# Patient Record
Sex: Male | Born: 1965 | Race: White | Hispanic: No | Marital: Married | Smoking: Never smoker
Health system: Southern US, Community
[De-identification: ages and names within clinical notes are randomized; demographics above are authoritative.]

## PROBLEM LIST (undated history)

## (undated) DIAGNOSIS — K449 Diaphragmatic hernia without obstruction or gangrene: Secondary | ICD-10-CM

## (undated) HISTORY — PX: WISDOM TOOTH EXTRACTION: SHX21

## (undated) HISTORY — DX: Diaphragmatic hernia without obstruction or gangrene: K44.9

---

## 2015-09-18 ENCOUNTER — Other Ambulatory Visit: Payer: Self-pay | Admitting: Gastroenterology

## 2015-09-18 DIAGNOSIS — R1310 Dysphagia, unspecified: Secondary | ICD-10-CM

## 2015-09-18 DIAGNOSIS — R109 Unspecified abdominal pain: Secondary | ICD-10-CM

## 2015-09-19 ENCOUNTER — Ambulatory Visit
Admission: RE | Admit: 2015-09-19 | Discharge: 2015-09-19 | Disposition: A | Discharge status: Home / Self Care | Payer: PRIVATE HEALTH INSURANCE | Source: Ambulatory Visit | Attending: Gastroenterology | Admitting: Gastroenterology

## 2015-09-19 DIAGNOSIS — R109 Unspecified abdominal pain: Secondary | ICD-10-CM

## 2015-09-19 DIAGNOSIS — R1310 Dysphagia, unspecified: Secondary | ICD-10-CM

## 2015-10-24 ENCOUNTER — Ambulatory Visit: Payer: Self-pay | Admitting: Allergy and Immunology

## 2015-11-20 ENCOUNTER — Encounter: Payer: Self-pay | Admitting: Allergy and Immunology

## 2015-11-20 ENCOUNTER — Ambulatory Visit (INDEPENDENT_AMBULATORY_CARE_PROVIDER_SITE_OTHER): Payer: PRIVATE HEALTH INSURANCE | Admitting: Allergy and Immunology

## 2015-11-20 VITALS — BP 128/90 | HR 60 | Temp 97.8°F | Resp 16 | Ht 71.4 in | Wt 236.2 lb

## 2015-11-20 DIAGNOSIS — K219 Gastro-esophageal reflux disease without esophagitis: Secondary | ICD-10-CM | POA: Diagnosis not present

## 2015-11-20 DIAGNOSIS — J3089 Other allergic rhinitis: Secondary | ICD-10-CM | POA: Diagnosis not present

## 2015-11-20 DIAGNOSIS — J387 Other diseases of larynx: Secondary | ICD-10-CM

## 2015-11-20 NOTE — Patient Instructions (Addendum)
  1. Allergen avoidance measures?  2. Slowly, slowly taper off all caffeine and chocolate consumption  3. Utilize antireflux therapy for the remainder of 2017 with omeprazole 40 mg one time per day  4. Further evaluation for allergic disease?  5. Review biopsy results from upper endoscopy with Dr. Ewing SchleinMagod  6. Return to clinic 6 months or earlier if problem

## 2015-11-20 NOTE — Progress Notes (Signed)
NEW PATIENT NOTE  Referring Provider: No ref. provider found Primary Provider: Marylynn PearsonBurkhart,Brian, MD Date of office visit: 11/20/2015    Subjective:   Chief Complaint:  Andres Santos (DOB: 10/08/1965) is a 50 y.o. male with a chief complaint of Gastroesophageal Reflux  who presents to the clinic on 11/20/2015 with the following problems:  HPI: Andres Santos presents this clinic in evaluation of possible allergies. Apparently Andres Santos has a fair amount of problem with postnasal drip and throat clearing and raspy voice for which he has seen Dr. Marcheta GrammesMa and has had an upper endoscopy in evaluation of gastroesophageal reflux disease that may of been precipitated by consistent use of aspirin starting October 2016. His upper endoscopy was performed by Dr. Ewing SchleinMagod in 2017 and apparently identified inflammation although I do not have the results of that report for review at this point in time. He has very little upper airway symptoms except for some occasional congestion and apparently he has a deviated septum. He does not have any issues with ugly nasal discharge or inability to smell. On a rare occasion he'll get a pressure-like headache. In regard to his GERD he was treated for 3 months with a proton pump inhibitor and he thinks that most of his classic GERD symptoms are pretty much gone. He does drink one coffee per day and 2 teas per day. He still occasionally has a reflux event up into his throat.  Past Medical History  Diagnosis Date  . Hiatal hernia     Past Surgical History  Procedure Laterality Date  . Wisdom tooth extraction        Medication List    None    Allergies  Allergen Reactions  . Penicillins Rash    Review of systems negative except as noted in HPI / PMHx or noted below:  Review of Systems  Constitutional: Negative.   HENT: Negative.   Eyes: Negative.   Respiratory: Negative.   Cardiovascular: Negative.   Gastrointestinal: Negative.   Genitourinary: Negative.     Musculoskeletal: Negative.        Intermittent neck and shoulder pain  Skin: Negative.   Neurological: Negative.   Endo/Heme/Allergies: Negative.   Psychiatric/Behavioral: Negative.     Family History  Problem Relation Age of Onset  . Breast cancer Maternal Grandmother   . Heart Problems Maternal Grandfather     Social History   Social History  . Marital Status: Married    Spouse Name: N/A  . Number of Children: N/A  . Years of Education: N/A   Occupational History  . Not on file.   Social History Main Topics  . Smoking status: Never Smoker   . Smokeless tobacco: Never Used  . Alcohol Use: Yes  . Drug Use: No  . Sexual Activity: Not on file   Other Topics Concern  . Not on file   Social History Narrative  . 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  Raspy voice  HENT:  Head: Normocephalic. Head is without right periorbital erythema and without left periorbital erythema.  Right Ear: Tympanic membrane, external ear and ear canal normal.  Left Ear: Tympanic membrane, external ear and ear canal normal.  Nose: Nose normal. No mucosal edema or rhinorrhea.  Mouth/Throat: Oropharynx is clear and moist and mucous membranes are normal. No oropharyngeal exudate.  Eyes: Conjunctivae and lids are normal. Pupils are equal, round, and reactive to light.  Neck: Trachea normal. No tracheal deviation present. No thyromegaly present.  Cardiovascular: Normal  rate, regular rhythm, S1 normal, S2 normal and normal heart sounds.   No murmur heard. Pulmonary/Chest: Effort normal. No stridor. No tachypnea. No respiratory distress. He has no wheezes. He has no rales. He exhibits no tenderness.  Abdominal: Soft. He exhibits no distension and no mass. There is no hepatosplenomegaly. There is no tenderness. There is no rebound and no guarding.  Musculoskeletal: He exhibits no edema or tenderness.  Lymphadenopathy:       Head (right side): No tonsillar adenopathy present.       Head (left side): No tonsillar adenopathy present.    He has no cervical adenopathy.    He has no axillary adenopathy.  Neurological: He is alert. Gait normal.  Skin: No rash noted. He is not diaphoretic. No erythema. No pallor. Nails show no clubbing.  Psychiatric: Mood and affect normal.     Diagnostics: Allergy skin tests were performed. He did not demonstrate any hypersensitivity against a screening panel of foods or aeroallergens   Assessment and Plan:    1. Other allergic rhinitis   2. Gastroesophageal reflux disease, esophagitis presence not specified   3. LPRD (laryngopharyngeal reflux disease)     1. Allergen avoidance measures?  2. Slowly taper off all caffeine and chocolate consumption  3. Utilize antireflux therapy for the remainder of 2017 with omeprazole 40 mg one time per day  4. Further evaluation for allergic disease?  5. Review biopsy results from upper endoscopy with Dr. Ewing Schlein  6. Return to clinic 6 months or earlier if problem  It appears as though Andres Santos does have significant LPR which is still somewhat an active issue and I made a recommendation that he treat his reflux for at least the next 6 months by using a proton pump inhibitor and modifying his caffeine consumption. I will review the results of his recent upper endoscopy biopsy to see if there is any evidence of eosinophilic esophagitis. I don't think he needs any therapy for his mild upper  airway inflammation although than focusing on attention directed at his LPR at this point. I will see him back in this clinic in approximately 6 months or earlier if there is a problem.  Laurette Schimke, MD  [facility]

## 2017-12-31 IMAGING — RF DG UGI W/ HIGH DENSITY W/KUB
17 of 22 series · 18 of 24 positions shown · non-contrast
Comparison: CT scan 11/28/2013

CLINICAL DATA: Dysphagia and abdominal pain.

EXAM:
UPPER GI SERIES WITH KUB
TECHNIQUE: After obtaining a scout radiograph a routine upper GI series was
performed using thin and high density barium.
FLUOROSCOPY TIME:  Radiation Exposure Index (as provided by the
fluoroscopic device): 93 dGy cm2
If the device does not provide the exposure index:
Fluoroscopy Time (in minutes and seconds):  2 minutes and 12 seconds
Number of Acquired Images:

[Series 1: run · 1 of 11 slices shown (1 of 16)]
[im 1/11]
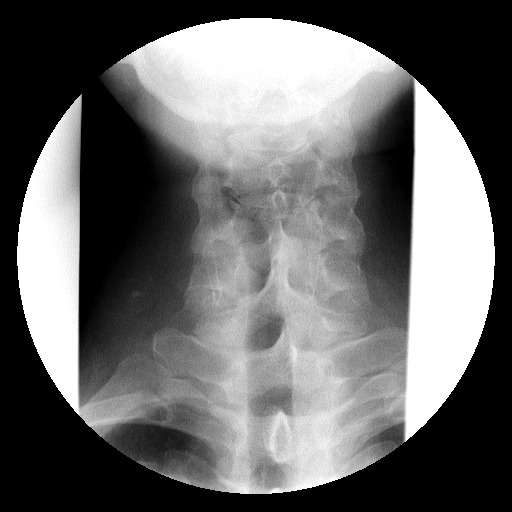

[Series 2: run · 2 of 11 slices shown (2 of 16)]
[im 1/11]
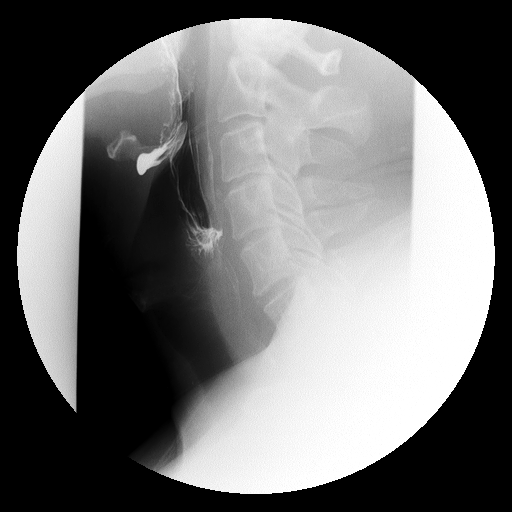
[im 11/11]
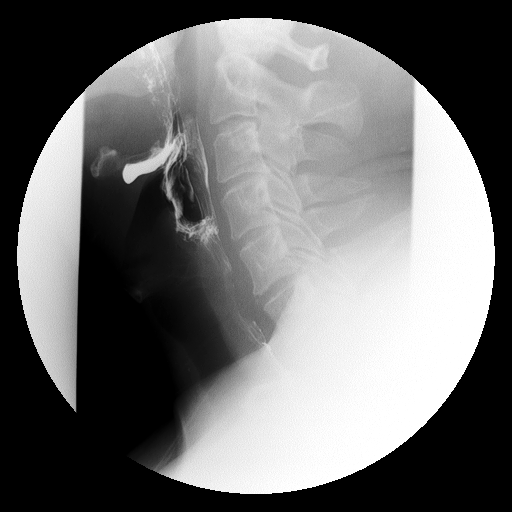

[Series 3: run · 1 of 5 slices shown (3 of 16)]
[im 1/5]
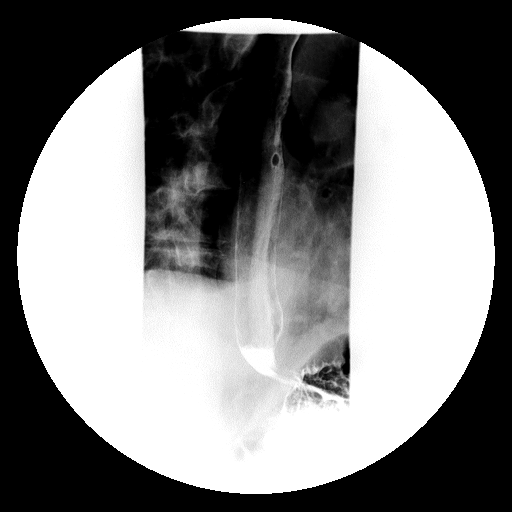

[Series 5: run · 1 of 3 slices shown (4 of 16)]
[im 1/3]
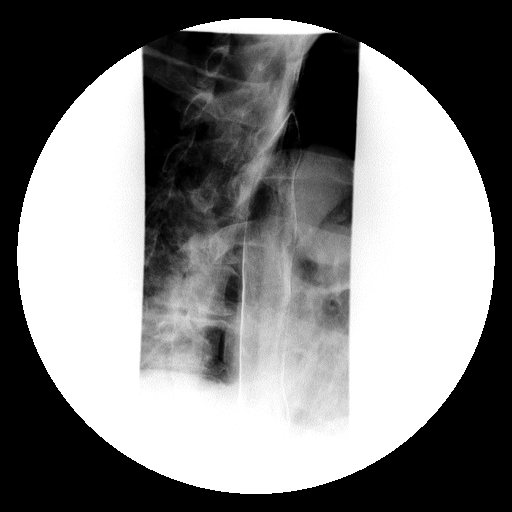

[Series 6: run · 1 of 1 slices shown (5 of 16)]
[im 1/1]
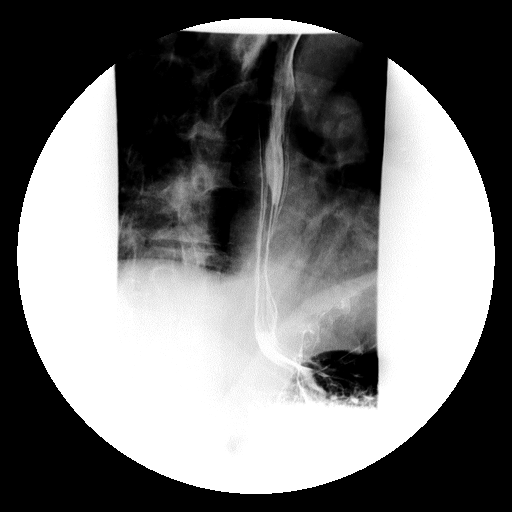

[Series 7: run · 1 of 1 slices shown (6 of 16)]
[im 1/1]
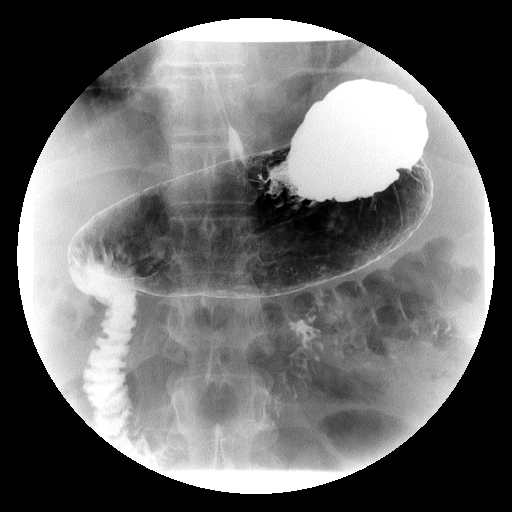

[Series 9: run · 1 of 1 slices shown (7 of 16)]
[im 1/1]
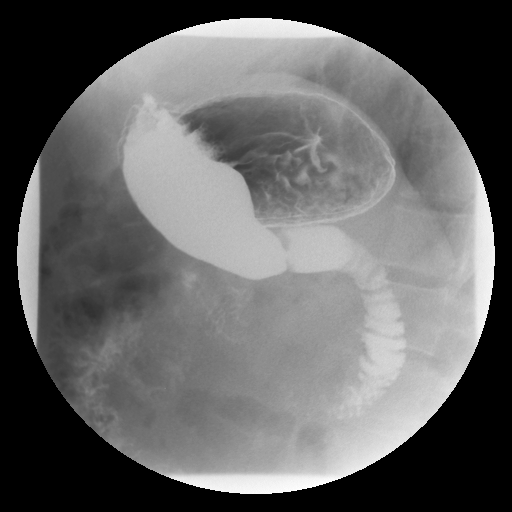

[Series 10: run · 1 of 1 slices shown (8 of 16)]
[im 1/1]
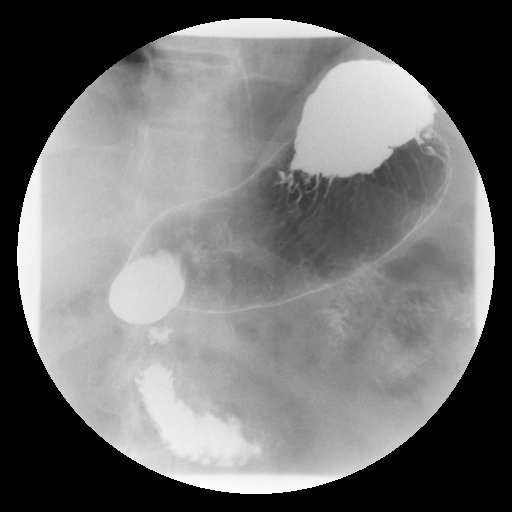

[Series 11: run · 1 of 1 slices shown (9 of 16)]
[im 1/1]
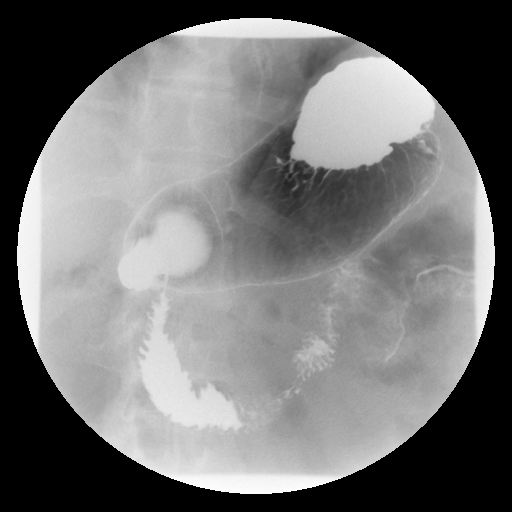

[Series 13: run · 1 of 1 slices shown (10 of 16)]
[im 1/1]
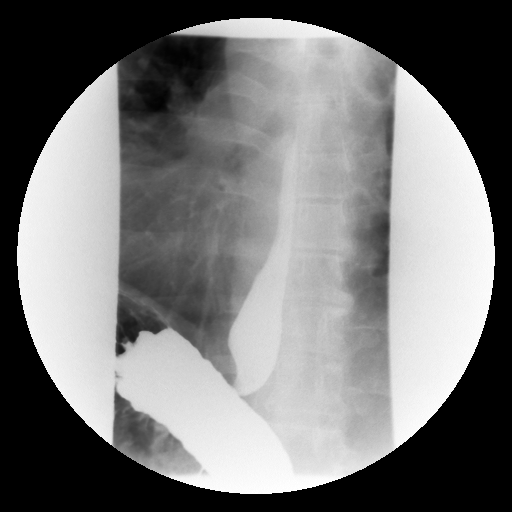

[Series 14: run · 1 of 1 slices shown (11 of 16)]
[im 1/1]
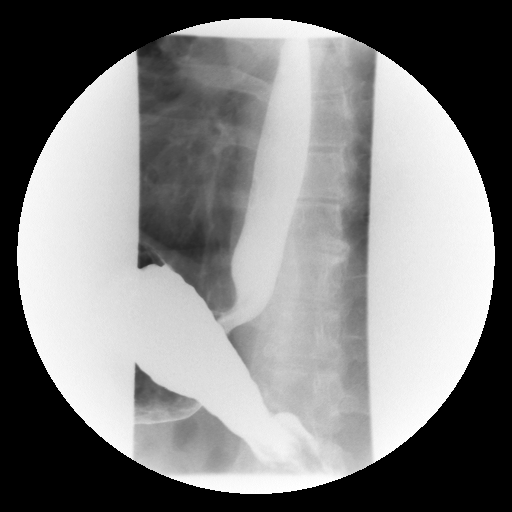

[Series 15: run · 1 of 1 slices shown (12 of 16)]
[im 1/1]
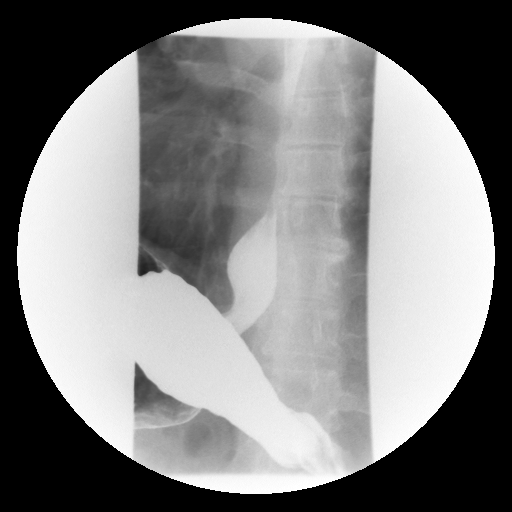

[Series 17: run · 1 of 1 slices shown (13 of 16)]
[im 1/1]
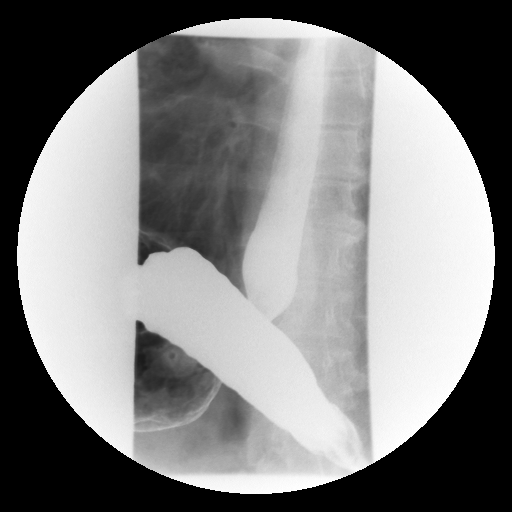

[Series 18: run · 1 of 1 slices shown (14 of 16)]
[im 1/1]
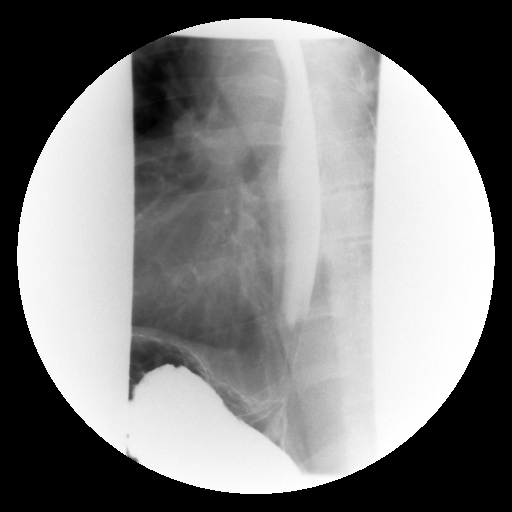

[Series 19: run · 1 of 1 slices shown (15 of 16)]
[im 1/1]
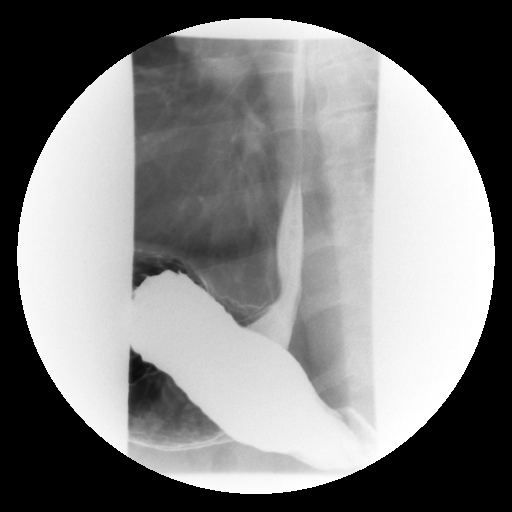

[Series 21: run · 1 of 1 slices shown (16 of 16)]
[im 1/1]
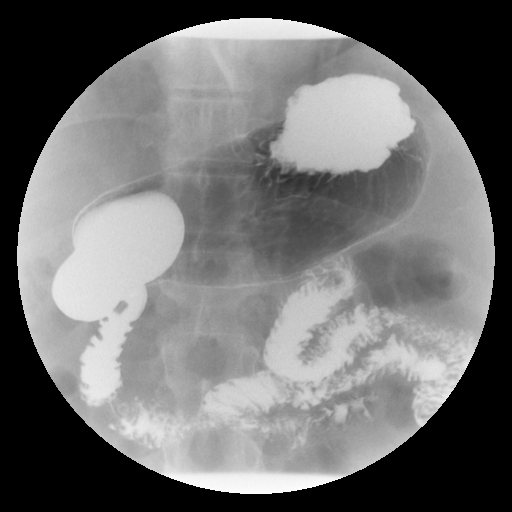

[Series 1001: view not recorded · 0.20mm/px · 1 of 1 slices shown]
[im 1/1]
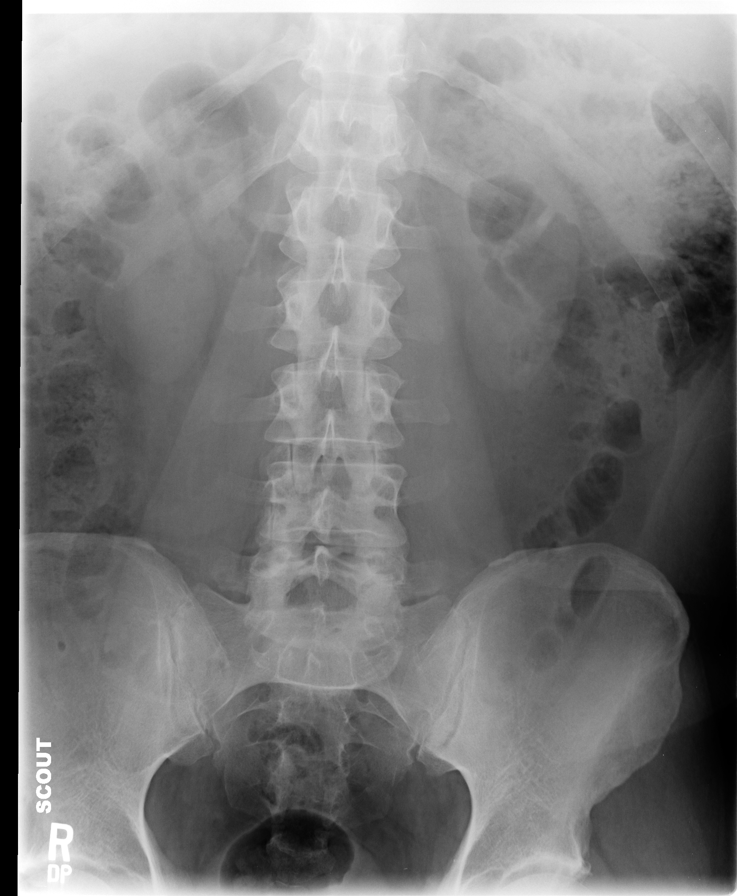

[18 of 24 positions shown; findings below may reference images not displayed]

FINDINGS: Initial barium swallows demonstrate normal pharyngeal motion with
swallowing. No laryngeal penetration or aspiration. No upper
esophageal webs, strictures or diverticuli.

Normal esophageal motility. No intrinsic or extrinsic lesions of the
esophagus were identified. No mucosal abnormalities. There is a
small sliding-type hiatal hernia and episodes of spontaneous GE
reflux.

The stomach is normal. Normal mucosal fold pattern. No ulcers. The
duodenum bulb and C-loop are normal. The duodenum jejunal junction
is in its normal anatomic location.
IMPRESSION: 1. Normal esophageal motility.
2. Small sliding-type hiatal hernia and episodes of spontaneous GE
reflux.
3. Normal stomach and duodenum.
